# Patient Record
Sex: Male | Born: 2005 | Race: Black or African American | Hispanic: No | Marital: Single | Smoking: Never smoker
Health system: Southern US, Community
[De-identification: ages and names within clinical notes are randomized; demographics above are authoritative.]

## PROBLEM LIST (undated history)

## (undated) DIAGNOSIS — J189 Pneumonia, unspecified organism: Secondary | ICD-10-CM

## (undated) DIAGNOSIS — G8929 Other chronic pain: Secondary | ICD-10-CM

## (undated) DIAGNOSIS — J45909 Unspecified asthma, uncomplicated: Secondary | ICD-10-CM

## (undated) HISTORY — DX: Other chronic pain: G89.29

---
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
## 2024-01-19 ENCOUNTER — Ambulatory Visit: Payer: Self-pay

## 2024-01-25 ENCOUNTER — Ambulatory Visit

## 2024-01-31 DIAGNOSIS — Z113 Encounter for screening for infections with a predominantly sexual mode of transmission: Secondary | ICD-10-CM | POA: Diagnosis not present

## 2024-02-16 ENCOUNTER — Encounter: Payer: Self-pay | Admitting: *Deleted
